# Patient Record
Sex: Female | Born: 1990 | Race: Black or African American | Hispanic: No | Marital: Single | Smoking: Never smoker
Health system: Southern US, Community
[De-identification: ages and names within clinical notes are randomized; demographics above are authoritative.]

## PROBLEM LIST (undated history)

## (undated) HISTORY — PX: APPENDECTOMY: SHX54

---

## 2013-03-09 ENCOUNTER — Observation Stay (HOSPITAL_COMMUNITY)
Admission: EM | Admit: 2013-03-09 | Discharge: 2013-03-10 | Disposition: A | Discharge status: Home / Self Care | Payer: BC Managed Care – PPO | Attending: General Surgery | Admitting: General Surgery

## 2013-03-09 ENCOUNTER — Encounter (HOSPITAL_COMMUNITY): Payer: Self-pay | Admitting: Emergency Medicine

## 2013-03-09 ENCOUNTER — Emergency Department (HOSPITAL_COMMUNITY): Payer: BC Managed Care – PPO

## 2013-03-09 DIAGNOSIS — K3580 Unspecified acute appendicitis: Principal | ICD-10-CM | POA: Insufficient documentation

## 2013-03-09 DIAGNOSIS — K37 Unspecified appendicitis: Secondary | ICD-10-CM

## 2013-03-09 LAB — BASIC METABOLIC PANEL
BUN: 12 mg/dL (ref 6–23)
CHLORIDE: 102 meq/L (ref 96–112)
CO2: 26 meq/L (ref 19–32)
Calcium: 9.4 mg/dL (ref 8.4–10.5)
Creatinine, Ser: 1.06 mg/dL (ref 0.50–1.10)
GFR calc Af Amer: 85 mL/min — ABNORMAL LOW (ref >90)
GFR calc non Af Amer: 73 mL/min — ABNORMAL LOW (ref >90)
GLUCOSE: 88 mg/dL (ref 70–99)
Potassium: 4.1 mEq/L (ref 3.7–5.3)
Sodium: 141 mEq/L (ref 137–147)

## 2013-03-09 LAB — CBC WITH DIFFERENTIAL/PLATELET
BASOS ABS: 0 10*3/uL (ref 0.0–0.1)
Basophils Relative: 0 % (ref 0–1)
Eosinophils Absolute: 0.1 10*3/uL (ref 0.0–0.7)
Eosinophils Relative: 1 % (ref 0–5)
HEMATOCRIT: 39.6 % (ref 36.0–46.0)
HEMOGLOBIN: 13.7 g/dL (ref 12.0–15.0)
LYMPHS ABS: 1.7 10*3/uL (ref 0.7–4.0)
LYMPHS PCT: 22 % (ref 12–46)
MCH: 30.4 pg (ref 26.0–34.0)
MCHC: 34.6 g/dL (ref 30.0–36.0)
MCV: 88 fL (ref 78.0–100.0)
MONO ABS: 0.9 10*3/uL (ref 0.1–1.0)
MONOS PCT: 11 % (ref 3–12)
NEUTROS ABS: 4.9 10*3/uL (ref 1.7–7.7)
Neutrophils Relative %: 65 % (ref 43–77)
Platelets: 260 10*3/uL (ref 150–400)
RBC: 4.5 MIL/uL (ref 3.87–5.11)
RDW: 12.8 % (ref 11.5–15.5)
WBC: 7.4 10*3/uL (ref 4.0–10.5)

## 2013-03-09 LAB — URINALYSIS, ROUTINE W REFLEX MICROSCOPIC
Bilirubin Urine: NEGATIVE
GLUCOSE, UA: NEGATIVE mg/dL
Hgb urine dipstick: NEGATIVE
Ketones, ur: NEGATIVE mg/dL
LEUKOCYTES UA: NEGATIVE
Nitrite: NEGATIVE
PH: 5.5 (ref 5.0–8.0)
Protein, ur: NEGATIVE mg/dL
Specific Gravity, Urine: 1.019 (ref 1.005–1.030)
Urobilinogen, UA: 0.2 mg/dL (ref 0.0–1.0)

## 2013-03-09 LAB — PREGNANCY, URINE: Preg Test, Ur: NEGATIVE

## 2013-03-09 MED ORDER — IOHEXOL 300 MG/ML  SOLN
25.0000 mL | INTRAMUSCULAR | Status: AC
  Administered 2013-03-09: 25 mL via ORAL

## 2013-03-09 MED ORDER — SODIUM CHLORIDE 0.9 % IV SOLN
Freq: Once | INTRAVENOUS | Status: AC
  Administered 2013-03-09: 16:00:00 via INTRAVENOUS

## 2013-03-09 MED ORDER — IOHEXOL 300 MG/ML  SOLN: 100.0000 mL | Freq: Once | INTRAMUSCULAR | Status: AC | PRN

## 2013-03-09 NOTE — ED Notes (Signed)
Pt finished contrast, CT notified

## 2013-03-09 NOTE — ED Notes (Signed)
To ED, [facility], for further eval of LLQ pain since yesterday morning. No fever, vomiting, or nausea associated. States her LMP was 4 days ago. States her last BM was yesterday and 'a little constipation with it'. Appears in nad.

## 2013-03-09 NOTE — ED Notes (Signed)
Pt aware she needs to provide urine sample 

## 2013-03-09 NOTE — ED Notes (Signed)
CT notified this RN that pt is 5 or 6 in line for scan.

## 2013-03-09 NOTE — ED Provider Notes (Signed)
CSN: [id number]     Arrival date & time 03/09/13  1252 History   First MD Initiated Contact with Patient 03/09/13 1541     Chief Complaint  Patient presents with  . Abdominal Pain   (Consider location/radiation/quality/duration/timing/severity/associated sxs/prior Treatment) The history is provided by the patient. No language interpreter was used.    Pt is a 23 yo female c/o RLQ pain. Pt states it is a sharp pain that woke her up from a sleep yesterday at around 0530.  She says it is a 6/10 pain that is worse with certain movements, such as laying supine, but better with standing. She denies hx of the same, urinary changes, vaginal discharge, bowel changes, fever, sick contacts, N/V, chills, or hx of abdominal surgery. She states her LMP was 4 days ago.  History reviewed. No pertinent past medical history. History reviewed. No pertinent past surgical history. No family history on file. History  Substance Use Topics  . Smoking status: Never Smoker   . Smokeless tobacco: Not on file  . Alcohol Use: Yes   OB History   Grav Para Term Preterm Abortions TAB SAB Ect Mult Living                 Review of Systems  Constitutional: Negative for fever and chills.  HENT: Negative.   Respiratory: Negative for shortness of breath and wheezing.   Cardiovascular: Negative for chest pain.  Gastrointestinal: Positive for abdominal pain. Negative for nausea, vomiting, diarrhea, constipation and blood in stool.  Genitourinary: Negative.   Musculoskeletal: Negative.   Neurological: Negative for weakness, numbness and headaches.    Allergies  Review of patient's allergies indicates no known allergies.  Home Medications  No current outpatient prescriptions on file. BP 118/72  Pulse 65  Temp(Src) 98.1 F (36.7 C) (Oral)  Resp 18  Ht 5\' 6"  (1.676 m)  Wt 160 lb (72.576 kg)  BMI 25.84 kg/m2  SpO2 100% Physical Exam  Constitutional: She is oriented to person, place, and time. She appears  well-developed and well-nourished. No distress.  Cardiovascular: Normal rate.  Exam reveals no gallop and no friction rub.   No murmur heard. Pulmonary/Chest: Effort normal and breath sounds normal. She has no wheezes.  Abdominal: Soft. Bowel sounds are normal. She exhibits no distension. There is tenderness in the right lower quadrant.  Musculoskeletal: Normal range of motion.  Neurological: She is alert and oriented to person, place, and time.  Skin: Skin is warm and dry. She is not diaphoretic.  Psychiatric: She has a normal mood and affect.    ED Course  Procedures (including critical care time) Labs Review Labs Reviewed - No data to display Imaging Review No results found.  EKG Interpretation   None      Results for orders placed during the hospital encounter of 03/09/13  SURGICAL PCR SCREEN      Result Value Range   MRSA, PCR NEGATIVE  NEGATIVE   Staphylococcus aureus NEGATIVE  NEGATIVE  CBC WITH DIFFERENTIAL      Result Value Range   WBC 7.4  4.0 - 10.5 K/uL   RBC 4.50  3.87 - 5.11 MIL/uL   Hemoglobin 13.7  12.0 - 15.0 g/dL   HCT 08.639.6  57.836.0 - 46.946.0 %   MCV 88.0  78.0 - 100.0 fL   MCH 30.4  26.0 - 34.0 pg   MCHC 34.6  30.0 - 36.0 g/dL   RDW 62.912.8  52.811.5 - 41.315.5 %   Platelets 260  150 - 400 K/uL   Neutrophils Relative % 65  43 - 77 %   Neutro Abs 4.9  1.7 - 7.7 K/uL   Lymphocytes Relative 22  12 - 46 %   Lymphs Abs 1.7  0.7 - 4.0 K/uL   Monocytes Relative 11  3 - 12 %   Monocytes Absolute 0.9  0.1 - 1.0 K/uL   Eosinophils Relative 1  0 - 5 %   Eosinophils Absolute 0.1  0.0 - 0.7 K/uL   Basophils Relative 0  0 - 1 %   Basophils Absolute 0.0  0.0 - 0.1 K/uL  BASIC METABOLIC PANEL      Result Value Range   Sodium 141  137 - 147 mEq/L   Potassium 4.1  3.7 - 5.3 mEq/L   Chloride 102  96 - 112 mEq/L   CO2 26  19 - 32 mEq/L   Glucose, Bld 88  70 - 99 mg/dL   BUN 12  6 - 23 mg/dL   Creatinine, Ser 1.61  0.50 - 1.10 mg/dL   Calcium 9.4  8.4 - 09.6 mg/dL   GFR calc  non Af Amer 73 (*) >90 mL/min   GFR calc Af Amer 85 (*) >90 mL/min  URINALYSIS, ROUTINE W REFLEX MICROSCOPIC      Result Value Range   Color, Urine YELLOW  YELLOW   APPearance CLEAR  CLEAR   Specific Gravity, Urine 1.019  1.005 - 1.030   pH 5.5  5.0 - 8.0   Glucose, UA NEGATIVE  NEGATIVE mg/dL   Hgb urine dipstick NEGATIVE  NEGATIVE   Bilirubin Urine NEGATIVE  NEGATIVE   Ketones, ur NEGATIVE  NEGATIVE mg/dL   Protein, ur NEGATIVE  NEGATIVE mg/dL   Urobilinogen, UA 0.2  0.0 - 1.0 mg/dL   Nitrite NEGATIVE  NEGATIVE   Leukocytes, UA NEGATIVE  NEGATIVE  PREGNANCY, URINE      Result Value Range   Preg Test, Ur NEGATIVE  NEGATIVE  CBC      Result Value Range   WBC 7.6  4.0 - 10.5 K/uL   RBC 4.40  3.87 - 5.11 MIL/uL   Hemoglobin 12.9  12.0 - 15.0 g/dL   HCT 04.5  40.9 - 81.1 %   MCV 87.3  78.0 - 100.0 fL   MCH 29.3  26.0 - 34.0 pg   MCHC 33.6  30.0 - 36.0 g/dL   RDW 91.4  78.2 - 95.6 %   Platelets 239  150 - 400 K/uL  COMPREHENSIVE METABOLIC PANEL      Result Value Range   Sodium 142  137 - 147 mEq/L   Potassium 3.9  3.7 - 5.3 mEq/L   Chloride 105  96 - 112 mEq/L   CO2 23  19 - 32 mEq/L   Glucose, Bld 94  70 - 99 mg/dL   BUN 12  6 - 23 mg/dL   Creatinine, Ser 2.13  0.50 - 1.10 mg/dL   Calcium 9.0  8.4 - 08.6 mg/dL   Total Protein 7.3  6.0 - 8.3 g/dL   Albumin 3.5  3.5 - 5.2 g/dL   AST 14  0 - 37 U/L   ALT 11  0 - 35 U/L   Alkaline Phosphatase 29 (*) 39 - 117 U/L   Total Bilirubin 0.6  0.3 - 1.2 mg/dL   GFR calc non Af Amer 88 (*) >90 mL/min   GFR calc Af Amer >90  >90 mL/min   Ct Abdomen Pelvis W  Contrast  03/09/2013   CLINICAL DATA:  Left lower quadrant abdominal pain.  EXAM: CT ABDOMEN AND PELVIS WITH CONTRAST  TECHNIQUE: Multidetector CT imaging of the abdomen and pelvis was performed using the standard protocol following bolus administration of intravenous contrast.  CONTRAST:  100 cc Omnipaque 300  COMPARISON:  None.  FINDINGS: The liver, spleen, pancreas, and adrenal  glands appear unremarkable. No specific gallbladder or biliary abnormality identified. Kidneys and proximal ureters unremarkable. No pathologic upper abdominal adenopathy is observed. No pathologic pelvic adenopathy is observed. Urinary bladder unremarkable.  Exaggerated lumbosacral carrying angle. Sacral curvature is exaggerated.  The appendiceal diameter 11 mm with periappendiceal stranding. No regional abscess.  IMPRESSION: 1. Acute appendicitis, with appendiceal diameter of 11 mm and mild periappendiceal stranding. 2. Unusually exaggerated sacral curvature with increased lumbosacral carrying angle which can predispose to back pain. No current lumbar spine impingement.   Electronically Signed   By: Herbie Baltimore M.D.   On: 03/09/2013 21:01    MDM  No diagnosis found. 1. Acute appendicitis  6:35 PM Pt resting comfortably in bed watching Tv.  She states there has been no increase in pain.  No change in pain distribution.  Waiting on CT.  8:32 PM Pt in no increased pain, no change in distribution.  Resting comfortable in bed watching TV.  Waiting on CT.  CT shows acute appendicitis. Dr. Luisa Hart ([facility]) was consulted and accepted the patient onto surgical service.   Arnoldo Hooker, PA-C 03/12/13 [phone]

## 2013-03-09 NOTE — ED Notes (Signed)
Pt states urine test was completed at [facility]

## 2013-03-09 NOTE — ED Notes (Signed)
Pt in CT.

## 2013-03-09 NOTE — ED Notes (Signed)
Pt ambulatory to restroom to provide urine sample.

## 2013-03-09 NOTE — ED Notes (Signed)
CT notified this RN that pt is 3rd in line.

## 2013-03-09 NOTE — H&P (Signed)
Amber Zuniga is an 23 y.o. female.   Chief Complaint: abdominal pain  HPI: 1 day hx of RLQ pain.  Moderate to severe sharp in nature made worse with movement. CT shows acute appendicitis. Anorexia present but [place] now.   History reviewed. No pertinent past medical history.  History reviewed. No pertinent past surgical history.  No family history on file. Social History:  reports that she has never smoked. She does not have any smokeless tobacco history on file. She reports that she drinks alcohol. Her drug history is not on file.  Allergies: No Known Allergies   (Not in a hospital admission)  Results for orders placed during the hospital encounter of 03/09/13 (from the past 48 hour(s))  CBC WITH DIFFERENTIAL     Status: None   Collection Time    03/09/13  4:16 PM      Result Value Range   WBC 7.4  4.0 - 10.5 K/uL   RBC 4.50  3.87 - 5.11 MIL/uL   Hemoglobin 13.7  12.0 - 15.0 g/dL   HCT 39.6  36.0 - 46.0 %   MCV 88.0  78.0 - 100.0 fL   MCH 30.4  26.0 - 34.0 pg   MCHC 34.6  30.0 - 36.0 g/dL   RDW 12.8  11.5 - 15.5 %   Platelets 260  150 - 400 K/uL   Neutrophils Relative % 65  43 - 77 %   Neutro Abs 4.9  1.7 - 7.7 K/uL   Lymphocytes Relative 22  12 - 46 %   Lymphs Abs 1.7  0.7 - 4.0 K/uL   Monocytes Relative 11  3 - 12 %   Monocytes Absolute 0.9  0.1 - 1.0 K/uL   Eosinophils Relative 1  0 - 5 %   Eosinophils Absolute 0.1  0.0 - 0.7 K/uL   Basophils Relative 0  0 - 1 %   Basophils Absolute 0.0  0.0 - 0.1 K/uL  BASIC METABOLIC PANEL     Status: Abnormal   Collection Time    03/09/13  4:16 PM      Result Value Range   Sodium 141  137 - 147 mEq/L   Potassium 4.1  3.7 - 5.3 mEq/L   Chloride 102  96 - 112 mEq/L   CO2 26  19 - 32 mEq/L   Glucose, Bld 88  70 - 99 mg/dL   BUN 12  6 - 23 mg/dL   Creatinine, Ser 1.06  0.50 - 1.10 mg/dL   Calcium 9.4  8.4 - 10.5 mg/dL   GFR calc non Af Amer 73 (*) >90 mL/min   GFR calc Af Amer 85 (*) >90 mL/min   Comment: (NOTE)     The  eGFR has been calculated using the CKD EPI equation.     This calculation has not been validated in all clinical situations.     eGFR's persistently <90 mL/min signify possible Chronic Kidney     Disease.  URINALYSIS, ROUTINE W REFLEX MICROSCOPIC     Status: None   Collection Time    03/09/13  5:09 PM      Result Value Range   Color, Urine YELLOW  YELLOW   APPearance CLEAR  CLEAR   Specific Gravity, Urine 1.019  1.005 - 1.030   pH 5.5  5.0 - 8.0   Glucose, UA NEGATIVE  NEGATIVE mg/dL   Hgb urine dipstick NEGATIVE  NEGATIVE   Bilirubin Urine NEGATIVE  NEGATIVE   Ketones, ur NEGATIVE  NEGATIVE mg/dL   Protein, ur NEGATIVE  NEGATIVE mg/dL   Urobilinogen, UA 0.2  0.0 - 1.0 mg/dL   Nitrite NEGATIVE  NEGATIVE   Leukocytes, UA NEGATIVE  NEGATIVE   Comment: MICROSCOPIC NOT DONE ON URINES WITH NEGATIVE PROTEIN, BLOOD, LEUKOCYTES, NITRITE, OR GLUCOSE <1000 mg/dL.  PREGNANCY, URINE     Status: None   Collection Time    03/09/13  5:09 PM      Result Value Range   Preg Test, Ur NEGATIVE  NEGATIVE   Comment:            THE SENSITIVITY OF THIS     METHODOLOGY IS >20 mIU/mL.   Ct Abdomen Pelvis W Contrast  03/09/2013   CLINICAL DATA:  Left lower quadrant abdominal pain.  EXAM: CT ABDOMEN AND PELVIS WITH CONTRAST  TECHNIQUE: Multidetector CT imaging of the abdomen and pelvis was performed using the standard protocol following bolus administration of intravenous contrast.  CONTRAST:  100 cc Omnipaque 300  COMPARISON:  None.  FINDINGS: The liver, spleen, pancreas, and adrenal glands appear unremarkable. No specific gallbladder or biliary abnormality identified. Kidneys and proximal ureters unremarkable. No pathologic upper abdominal adenopathy is observed. No pathologic pelvic adenopathy is observed. Urinary bladder unremarkable.  Exaggerated lumbosacral carrying angle. Sacral curvature is exaggerated.  The appendiceal diameter 11 mm with periappendiceal stranding. No regional abscess.  IMPRESSION: 1.  Acute appendicitis, with appendiceal diameter of 11 mm and mild periappendiceal stranding. 2. Unusually exaggerated sacral curvature with increased lumbosacral carrying angle which can predispose to back pain. No current lumbar spine impingement.   Electronically Signed   By: Sherryl Barters M.D.   On: 03/09/2013 21:01    Review of Systems  Constitutional: Negative for fever and chills.  HENT: Negative.   Eyes: Negative.   Respiratory: Negative.   Gastrointestinal: Positive for abdominal pain.  Genitourinary: Negative.   Musculoskeletal: Negative.   Skin: Negative.   Neurological: Negative.   Endo/Heme/Allergies: Negative.   Psychiatric/Behavioral: Negative.     Blood pressure 110/92, pulse 77, temperature 98.1 F (36.7 C), temperature source Oral, resp. rate 18, height 5' 6"  (1.676 m), weight 160 lb (72.576 kg), last menstrual period 03/05/2013, SpO2 100.00%. Physical Exam  Constitutional: She is oriented to person, place, and time. She appears well-developed and well-nourished.  HENT:  Head: Normocephalic and atraumatic.  Eyes: EOM are normal. Pupils are equal, round, and reactive to light.  Neck: Normal range of motion. Neck supple.  Cardiovascular: Normal rate and regular rhythm.   Respiratory: Effort normal and breath sounds normal.  GI: There is tenderness. There is rebound and tenderness at McBurney's point.  Musculoskeletal: Normal range of motion.  Neurological: She is alert and oriented to person, place, and time.  Skin: Skin is warm and dry.  Psychiatric: She has a normal mood and affect. Her behavior is normal. Judgment and thought content normal.     Assessment/Plan Acute appendicitis early Start IV ABX.   NPO Medical managent for now . If no better lap appendectomy in am.   Teri Diltz A. 03/09/2013, 10:38 PM

## 2013-03-09 NOTE — ED Notes (Signed)
Patient transported to CT 

## 2013-03-10 ENCOUNTER — Encounter: Payer: Self-pay | Admitting: General Surgery

## 2013-03-10 ENCOUNTER — Encounter (HOSPITAL_COMMUNITY): Payer: Self-pay | Admitting: *Deleted

## 2013-03-10 ENCOUNTER — Observation Stay (HOSPITAL_COMMUNITY): Payer: BC Managed Care – PPO | Admitting: Anesthesiology

## 2013-03-10 ENCOUNTER — Encounter (HOSPITAL_COMMUNITY)
Admission: EM | Disposition: A | Discharge status: Home / Self Care | Payer: Self-pay | Source: Home / Self Care | Attending: Emergency Medicine

## 2013-03-10 ENCOUNTER — Encounter (HOSPITAL_COMMUNITY): Payer: BC Managed Care – PPO | Admitting: Anesthesiology

## 2013-03-10 HISTORY — PX: LAPAROSCOPIC APPENDECTOMY: SHX408

## 2013-03-10 LAB — CBC
HEMATOCRIT: 38.4 % (ref 36.0–46.0)
Hemoglobin: 12.9 g/dL (ref 12.0–15.0)
MCH: 29.3 pg (ref 26.0–34.0)
MCHC: 33.6 g/dL (ref 30.0–36.0)
MCV: 87.3 fL (ref 78.0–100.0)
PLATELETS: 239 10*3/uL (ref 150–400)
RBC: 4.4 MIL/uL (ref 3.87–5.11)
RDW: 12.7 % (ref 11.5–15.5)
WBC: 7.6 10*3/uL (ref 4.0–10.5)

## 2013-03-10 LAB — COMPREHENSIVE METABOLIC PANEL
ALBUMIN: 3.5 g/dL (ref 3.5–5.2)
ALT: 11 U/L (ref 0–35)
AST: 14 U/L (ref 0–37)
Alkaline Phosphatase: 29 U/L — ABNORMAL LOW (ref 39–117)
BUN: 12 mg/dL (ref 6–23)
CO2: 23 mEq/L (ref 19–32)
CREATININE: 0.91 mg/dL (ref 0.50–1.10)
Calcium: 9 mg/dL (ref 8.4–10.5)
Chloride: 105 mEq/L (ref 96–112)
GFR calc Af Amer: >90 mL/min (ref >90)
GFR calc non Af Amer: 88 mL/min — ABNORMAL LOW (ref >90)
Glucose, Bld: 94 mg/dL (ref 70–99)
Potassium: 3.9 mEq/L (ref 3.7–5.3)
Sodium: 142 mEq/L (ref 137–147)
TOTAL PROTEIN: 7.3 g/dL (ref 6.0–8.3)
Total Bilirubin: 0.6 mg/dL (ref 0.3–1.2)

## 2013-03-10 LAB — SURGICAL PCR SCREEN
MRSA, PCR: NEGATIVE
STAPHYLOCOCCUS AUREUS: NEGATIVE

## 2013-03-10 SURGERY — APPENDECTOMY, LAPAROSCOPIC
Anesthesia: General | Site: Abdomen

## 2013-03-10 MED ORDER — IBUPROFEN 200 MG PO TABS: ORAL_TABLET | ORAL | Status: AC

## 2013-03-10 MED ORDER — MIDAZOLAM HCL 5 MG/5ML IJ SOLN
INTRAMUSCULAR | Status: DC | PRN
  Administered 2013-03-10: 2 mg via INTRAVENOUS

## 2013-03-10 MED ORDER — FENTANYL CITRATE 0.05 MG/ML IJ SOLN
INTRAMUSCULAR | Status: AC
  Filled 2013-03-10: qty 5

## 2013-03-10 MED ORDER — ROCURONIUM BROMIDE 50 MG/5ML IV SOLN
INTRAVENOUS | Status: AC
  Filled 2013-03-10: qty 1

## 2013-03-10 MED ORDER — OXYCODONE-ACETAMINOPHEN 5-325 MG PO TABS
1.0000 | ORAL_TABLET | ORAL | Status: DC | PRN
  Administered 2013-03-10: 2 via ORAL
  Filled 2013-03-10: qty 2

## 2013-03-10 MED ORDER — ONDANSETRON HCL 4 MG/2ML IJ SOLN: 4.0000 mg | Freq: Four times a day (QID) | INTRAMUSCULAR | Status: DC | PRN

## 2013-03-10 MED ORDER — OXYCODONE-ACETAMINOPHEN 5-325 MG PO TABS: 1.0000 | ORAL_TABLET | ORAL | Status: DC | PRN

## 2013-03-10 MED ORDER — PROPOFOL 10 MG/ML IV BOLUS
INTRAVENOUS | Status: AC
  Filled 2013-03-10: qty 20

## 2013-03-10 MED ORDER — MIDAZOLAM HCL 2 MG/2ML IJ SOLN
INTRAMUSCULAR | Status: AC
  Filled 2013-03-10: qty 2

## 2013-03-10 MED ORDER — PANTOPRAZOLE SODIUM 40 MG IV SOLR
40.0000 mg | Freq: Every day | INTRAVENOUS | Status: DC
Start: 2013-03-10 — End: 2013-03-10
  Administered 2013-03-10: 40 mg via INTRAVENOUS
  Filled 2013-03-10 (×2): qty 40

## 2013-03-10 MED ORDER — HYDROMORPHONE HCL PF 1 MG/ML IJ SOLN
1.0000 mg | INTRAMUSCULAR | Status: DC | PRN
  Administered 2013-03-10 (×2): 1 mg via INTRAVENOUS
  Filled 2013-03-10 (×2): qty 1

## 2013-03-10 MED ORDER — OXYCODONE HCL 5 MG PO TABS: 5.0000 mg | ORAL_TABLET | Freq: Once | ORAL | Status: DC | PRN

## 2013-03-10 MED ORDER — GLYCOPYRROLATE 0.2 MG/ML IJ SOLN
INTRAMUSCULAR | Status: AC
  Filled 2013-03-10: qty 1

## 2013-03-10 MED ORDER — ARTIFICIAL TEARS OP OINT
TOPICAL_OINTMENT | OPHTHALMIC | Status: AC
  Filled 2013-03-10: qty 3.5

## 2013-03-10 MED ORDER — SODIUM CHLORIDE 0.9 % IR SOLN
Status: DC | PRN
  Administered 2013-03-10: 1000 mL

## 2013-03-10 MED ORDER — BUPIVACAINE-EPINEPHRINE 0.25% -1:200000 IJ SOLN
INTRAMUSCULAR | Status: DC | PRN
  Administered 2013-03-10: 15 mL

## 2013-03-10 MED ORDER — ACETAMINOPHEN 325 MG PO TABS: 650.0000 mg | ORAL_TABLET | Freq: Four times a day (QID) | ORAL | Status: AC | PRN

## 2013-03-10 MED ORDER — SUCCINYLCHOLINE CHLORIDE 20 MG/ML IJ SOLN
INTRAMUSCULAR | Status: DC | PRN
  Administered 2013-03-10: 120 mg via INTRAVENOUS

## 2013-03-10 MED ORDER — 0.9 % SODIUM CHLORIDE (POUR BTL) OPTIME
TOPICAL | Status: DC | PRN
  Administered 2013-03-10: 1000 mL

## 2013-03-10 MED ORDER — HYDROMORPHONE HCL PF 1 MG/ML IJ SOLN
INTRAMUSCULAR | Status: AC
  Filled 2013-03-10: qty 1

## 2013-03-10 MED ORDER — DEXTROSE IN LACTATED RINGERS 5 % IV SOLN
INTRAVENOUS | Status: DC
  Administered 2013-03-10: 04:00:00 via INTRAVENOUS

## 2013-03-10 MED ORDER — PROMETHAZINE HCL 25 MG/ML IJ SOLN: 6.2500 mg | INTRAMUSCULAR | Status: DC | PRN

## 2013-03-10 MED ORDER — ENOXAPARIN SODIUM 40 MG/0.4ML ~~LOC~~ SOLN
40.0000 mg | SUBCUTANEOUS | Status: DC
  Administered 2013-03-10: 40 mg via SUBCUTANEOUS
  Filled 2013-03-10: qty 0.4

## 2013-03-10 MED ORDER — SUCCINYLCHOLINE CHLORIDE 20 MG/ML IJ SOLN
INTRAMUSCULAR | Status: AC
  Filled 2013-03-10: qty 1

## 2013-03-10 MED ORDER — LIDOCAINE HCL (CARDIAC) 20 MG/ML IV SOLN
INTRAVENOUS | Status: DC | PRN
  Administered 2013-03-10: 100 mg via INTRAVENOUS

## 2013-03-10 MED ORDER — ARTIFICIAL TEARS OP OINT
TOPICAL_OINTMENT | OPHTHALMIC | Status: DC | PRN
  Administered 2013-03-10: 1 via OPHTHALMIC

## 2013-03-10 MED ORDER — NEOSTIGMINE METHYLSULFATE 1 MG/ML IJ SOLN
INTRAMUSCULAR | Status: DC | PRN
  Administered 2013-03-10: 3 mg via INTRAVENOUS
  Administered 2013-03-10: 1 mg via INTRAVENOUS

## 2013-03-10 MED ORDER — PROPOFOL 10 MG/ML IV BOLUS
INTRAVENOUS | Status: DC | PRN
  Administered 2013-03-10: 200 mg via INTRAVENOUS

## 2013-03-10 MED ORDER — FENTANYL CITRATE 0.05 MG/ML IJ SOLN
INTRAMUSCULAR | Status: DC | PRN
  Administered 2013-03-10 (×2): 50 ug via INTRAVENOUS

## 2013-03-10 MED ORDER — MEPERIDINE HCL 25 MG/ML IJ SOLN: 6.2500 mg | INTRAMUSCULAR | Status: DC | PRN

## 2013-03-10 MED ORDER — HYDROMORPHONE HCL PF 1 MG/ML IJ SOLN
0.2500 mg | INTRAMUSCULAR | Status: DC | PRN
  Administered 2013-03-10 (×2): 0.25 mg via INTRAVENOUS

## 2013-03-10 MED ORDER — OXYCODONE HCL 5 MG/5ML PO SOLN: 5.0000 mg | Freq: Once | ORAL | Status: DC | PRN

## 2013-03-10 MED ORDER — NEOSTIGMINE METHYLSULFATE 1 MG/ML IJ SOLN
INTRAMUSCULAR | Status: AC
  Filled 2013-03-10: qty 10

## 2013-03-10 MED ORDER — GLYCOPYRROLATE 0.2 MG/ML IJ SOLN
INTRAMUSCULAR | Status: DC | PRN
  Administered 2013-03-10: 0.6 mg via INTRAVENOUS
  Administered 2013-03-10: 0.2 mg via INTRAVENOUS

## 2013-03-10 MED ORDER — LACTATED RINGERS IV SOLN
INTRAVENOUS | Status: DC | PRN
  Administered 2013-03-10 (×2): via INTRAVENOUS

## 2013-03-10 MED ORDER — DEXTROSE 5 % IV SOLN
INTRAVENOUS | Status: AC
  Filled 2013-03-10: qty 1

## 2013-03-10 MED ORDER — ACETAMINOPHEN 325 MG PO TABS: 650.0000 mg | ORAL_TABLET | Freq: Four times a day (QID) | ORAL | Status: DC | PRN

## 2013-03-10 MED ORDER — ROCURONIUM BROMIDE 100 MG/10ML IV SOLN
INTRAVENOUS | Status: DC | PRN
  Administered 2013-03-10: 25 mg via INTRAVENOUS

## 2013-03-10 MED ORDER — LIDOCAINE HCL (CARDIAC) 20 MG/ML IV SOLN
INTRAVENOUS | Status: AC
  Filled 2013-03-10: qty 5

## 2013-03-10 MED ORDER — ONDANSETRON HCL 4 MG/2ML IJ SOLN
INTRAMUSCULAR | Status: DC | PRN
  Administered 2013-03-10: 4 mg via INTRAVENOUS

## 2013-03-10 MED ORDER — BUPIVACAINE-EPINEPHRINE (PF) 0.25% -1:200000 IJ SOLN
INTRAMUSCULAR | Status: AC
  Filled 2013-03-10: qty 30

## 2013-03-10 MED ORDER — ACETAMINOPHEN 650 MG RE SUPP: 650.0000 mg | Freq: Four times a day (QID) | RECTAL | Status: DC | PRN

## 2013-03-10 MED ORDER — BSS IO SOLN
INTRAOCULAR | Status: AC
  Filled 2013-03-10: qty 15

## 2013-03-10 MED ORDER — LACTATED RINGERS IV SOLN
Freq: Once | INTRAVENOUS | Status: AC
  Administered 2013-03-10: 09:00:00 via INTRAVENOUS

## 2013-03-10 MED ORDER — DEXTROSE 5 % IV SOLN
1.0000 g | Freq: Four times a day (QID) | INTRAVENOUS | Status: DC
  Administered 2013-03-10 (×3): 1 g via INTRAVENOUS
  Filled 2013-03-10 (×6): qty 1

## 2013-03-10 SURGICAL SUPPLY — 37 items
APPLIER CLIP ROT 10 11.4 M/L (STAPLE)
CANISTER SUCTION 2500CC (MISCELLANEOUS) ×3 IMPLANT
CHLORAPREP W/TINT 26ML (MISCELLANEOUS) ×3 IMPLANT
CLIP APPLIE ROT 10 11.4 M/L (STAPLE) IMPLANT
CONT SPEC STER OR (MISCELLANEOUS) ×6 IMPLANT
COVER SURGICAL LIGHT HANDLE (MISCELLANEOUS) ×3 IMPLANT
CUTTER FLEX LINEAR 45M (STAPLE) ×3 IMPLANT
DERMABOND ADVANCED (GAUZE/BANDAGES/DRESSINGS) ×2
DERMABOND ADVANCED .7 DNX12 (GAUZE/BANDAGES/DRESSINGS) ×1 IMPLANT
ELECT REM PT RETURN 9FT ADLT (ELECTROSURGICAL) ×3
ELECTRODE REM PT RTRN 9FT ADLT (ELECTROSURGICAL) ×1 IMPLANT
GLOVE BIO SURGEON STRL SZ7 (GLOVE) ×3 IMPLANT
GLOVE BIOGEL PI IND STRL 7.0 (GLOVE) ×2 IMPLANT
GLOVE BIOGEL PI IND STRL 7.5 (GLOVE) ×1 IMPLANT
GLOVE BIOGEL PI INDICATOR 7.0 (GLOVE) ×4
GLOVE BIOGEL PI INDICATOR 7.5 (GLOVE) ×2
GLOVE SURG SS PI 7.0 STRL IVOR (GLOVE) ×6 IMPLANT
GOWN STRL NON-REIN LRG LVL3 (GOWN DISPOSABLE) ×9 IMPLANT
KIT BASIN OR (CUSTOM PROCEDURE TRAY) ×3 IMPLANT
KIT ROOM TURNOVER OR (KITS) ×3 IMPLANT
NS IRRIG 1000ML POUR BTL (IV SOLUTION) ×3 IMPLANT
PAD ARMBOARD 7.5X6 YLW CONV (MISCELLANEOUS) ×6 IMPLANT
POUCH SPECIMEN RETRIEVAL 10MM (ENDOMECHANICALS) ×3 IMPLANT
RELOAD 45 VASCULAR/THIN (ENDOMECHANICALS) ×3 IMPLANT
RELOAD STAPLE TA45 3.5 REG BLU (ENDOMECHANICALS) ×3 IMPLANT
SCALPEL HARMONIC ACE (MISCELLANEOUS) ×3 IMPLANT
SCISSORS LAP 5X35 DISP (ENDOMECHANICALS) IMPLANT
SET IRRIG TUBING LAPAROSCOPIC (IRRIGATION / IRRIGATOR) ×3 IMPLANT
SLEEVE ENDOPATH XCEL 5M (ENDOMECHANICALS) ×3 IMPLANT
SPECIMEN JAR SMALL (MISCELLANEOUS) ×3 IMPLANT
SUT MNCRL AB 4-0 PS2 18 (SUTURE) ×3 IMPLANT
TOWEL OR 17X24 6PK STRL BLUE (TOWEL DISPOSABLE) ×3 IMPLANT
TOWEL OR 17X26 10 PK STRL BLUE (TOWEL DISPOSABLE) ×3 IMPLANT
TRAY FOLEY CATH 16FR SILVER (SET/KITS/TRAYS/PACK) ×3 IMPLANT
TRAY LAPAROSCOPIC (CUSTOM PROCEDURE TRAY) ×3 IMPLANT
TROCAR XCEL BLUNT TIP 100MML (ENDOMECHANICALS) ×3 IMPLANT
TROCAR XCEL NON-BLD 5MMX100MML (ENDOMECHANICALS) ×3 IMPLANT

## 2013-03-10 NOTE — ED Notes (Signed)
Transporting patient to new room assignment. 

## 2013-03-10 NOTE — Anesthesia Procedure Notes (Signed)
Procedure Name: Intubation Date/Time: 03/10/2013 9:35 AM Performed by: Fransisca KaufmannMEYER, Caydyn Sprung E Pre-anesthesia Checklist: Patient identified, Emergency Drugs available, Suction available, Patient being monitored and Timeout performed Patient Re-evaluated:Patient Re-evaluated prior to inductionOxygen Delivery Method: Circle system utilized Preoxygenation: Pre-oxygenation with 100% oxygen Intubation Type: IV induction and Cricoid Pressure applied Laryngoscope Size: Miller and 2 Grade View: Grade I Tube type: Oral Tube size: 7.5 mm Number of attempts: 1 Airway Equipment and Method: Stylet Placement Confirmation: ETT inserted through vocal cords under direct vision,  positive ETCO2 and breath sounds checked- equal and bilateral Secured at: 22 cm Tube secured with: Tape Dental Injury: Teeth and Oropharynx as per pre-operative assessment

## 2013-03-10 NOTE — Progress Notes (Signed)
Per MD order, pt walking, voiding and eating/tolerating solid foods. No nausea/vomiting. Pt discharged to home. D/c instructions given, no questions verbalized. Vitals stable.

## 2013-03-10 NOTE — OR Nursing (Signed)
CONTACTS REMOVED BY CRNA AND GIVEN TO PT'S BOYFRIEND IN WAITING ROOM. RING AND UNDERWEAR REMOVED IN OR AND PLACED IN LABELED BAGS. SENT WITH PATIENT TO PACU.

## 2013-03-10 NOTE — Progress Notes (Signed)
  Subjective: Still with rlq pain  Objective: Vital signs in last 24 hours: Temp:  [98.1 F (36.7 C)-98.5 F (36.9 C)] 98.5 F (36.9 C) (02/02 0610) Pulse Rate:  [57-80] 59 (02/02 0610) Resp:  [16-18] 18 (02/02 0610) BP: (104-125)/(70-92) 114/70 mmHg (02/02 0610) SpO2:  [97 %-100 %] 98 % (02/02 0610) Weight:  [160 lb (72.576 kg)] 160 lb (72.576 kg) (02/01 1300)    Intake/Output from previous day:   Intake/Output this shift:    GI: soft, rlq tenderness  Lab Results:   Recent Labs  03/09/13 1616 03/10/13 0545  WBC 7.4 7.6  HGB 13.7 12.9  HCT 39.6 38.4  PLT 260 239   BMET  Recent Labs  03/09/13 1616 03/10/13 0545  NA 141 142  K 4.1 3.9  CL 102 105  CO2 26 23  GLUCOSE 88 94  BUN 12 12  CREATININE 1.06 0.91  CALCIUM 9.4 9.0   PT/INR No results found for this basename: LABPROT, INR,  in the last 72 hours ABG No results found for this basename: PHART, PCO2, PO2, HCO3,  in the last 72 hours  Studies/Results: Ct Abdomen Pelvis W Contrast  03/09/2013   CLINICAL DATA:  Left lower quadrant abdominal pain.  EXAM: CT ABDOMEN AND PELVIS WITH CONTRAST  TECHNIQUE: Multidetector CT imaging of the abdomen and pelvis was performed using the standard protocol following bolus administration of intravenous contrast.  CONTRAST:  100 cc Omnipaque 300  COMPARISON:  None.  FINDINGS: The liver, spleen, pancreas, and adrenal glands appear unremarkable. No specific gallbladder or biliary abnormality identified. Kidneys and proximal ureters unremarkable. No pathologic upper abdominal adenopathy is observed. No pathologic pelvic adenopathy is observed. Urinary bladder unremarkable.  Exaggerated lumbosacral carrying angle. Sacral curvature is exaggerated.  The appendiceal diameter 11 mm with periappendiceal stranding. No regional abscess.  IMPRESSION: 1. Acute appendicitis, with appendiceal diameter of 11 mm and mild periappendiceal stranding. 2. Unusually exaggerated sacral curvature with  increased lumbosacral carrying angle which can predispose to back pain. No current lumbar spine impingement.   Electronically Signed   By: Herbie BaltimoreWalt  Liebkemann M.D.   On: 03/09/2013 21:01    Anti-infectives: Anti-infectives   Start     Dose/Rate Route Frequency Ordered Stop   03/10/13 0045  cefOXitin (MEFOXIN) 1 g in dextrose 5 % 50 mL IVPB     1 g 100 mL/hr over 30 Minutes Intravenous 4 times per day 03/10/13 0036        Assessment/Plan: Acute appendicitis  Lap appy this am.discussed risks/benefits    [facility] CenterWAKEFIELD,Khiley Lieser 03/10/2013

## 2013-03-10 NOTE — Anesthesia Preprocedure Evaluation (Signed)
Anesthesia Evaluation  Patient identified by MRN, date of birth, ID band Patient awake    Reviewed: Allergy & Precautions, H&P , NPO status , Patient's Chart, lab work & pertinent test results  Airway       Dental  (+) Dental Advisory Given   Pulmonary neg pulmonary ROS,  breath sounds clear to auscultation        Cardiovascular negative cardio ROS  Rhythm:Regular Rate:Normal     Neuro/Psych negative neurological ROS  negative psych ROS   GI/Hepatic negative GI ROS, Neg liver ROS,   Endo/Other  negative endocrine ROS  Renal/GU negative Renal ROS  negative genitourinary   Musculoskeletal negative musculoskeletal ROS (+)   Abdominal   Peds  Hematology negative hematology ROS (+)   Anesthesia Other Findings   Reproductive/Obstetrics negative OB ROS                           Anesthesia Physical Anesthesia Plan  ASA: I and emergent  Anesthesia Plan: General   Post-op Pain Management:    Induction: Intravenous and Rapid sequence  Airway Management Planned: Oral ETT  Additional Equipment:   Intra-op Plan:   Post-operative Plan: Extubation in OR  Informed Consent: I have reviewed the patients History and Physical, chart, labs and discussed the procedure including the risks, benefits and alternatives for the proposed anesthesia with the patient or authorized representative who has indicated his/her understanding and acceptance.   Dental advisory given  Plan Discussed with: CRNA  Anesthesia Plan Comments:         Anesthesia Quick Evaluation

## 2013-03-10 NOTE — Transfer of Care (Signed)
Immediate Anesthesia Transfer of Care Note  Patient: Amber PollockCarissa Zuniga  Procedure(s) Performed: Procedure(s): LAPAROSCOPIC APPENDECTOMY (N/A)  Patient Location: PACU  Anesthesia Type:General  Level of Consciousness: awake, alert , oriented and sedated  Airway & Oxygen Therapy: Patient Spontanous Breathing and Patient connected to nasal cannula oxygen  Post-op Assessment: Report given to PACU RN, Post -op Vital signs reviewed and stable and Patient moving all extremities  Post vital signs: Reviewed and stable  Complications: No apparent anesthesia complications

## 2013-03-10 NOTE — Progress Notes (Signed)
UR completed 

## 2013-03-10 NOTE — Anesthesia Postprocedure Evaluation (Signed)
Anesthesia Post Note  Patient: Jeffie PollockCarissa Isaac  Procedure(s) Performed: Procedure(s) (LRB): LAPAROSCOPIC APPENDECTOMY (N/A)  Anesthesia type: General  Patient location: PACU  Post pain: Pain level controlled  Post assessment: Post-op Vital signs reviewed  Last Vitals: BP 114/73  Pulse 81  Temp(Src) 37 C (Oral)  Resp 17  Ht 5\' 6"  (1.676 m)  Wt 160 lb (72.576 kg)  BMI 25.84 kg/m2  SpO2 100%  LMP 03/05/2013  Post vital signs: Reviewed  Level of consciousness: sedated  Complications: No apparent anesthesia complications

## 2013-03-10 NOTE — Progress Notes (Signed)
Patient arrived safely to the unit via wheelchair from the ED.

## 2013-03-10 NOTE — Discharge Instructions (Signed)
[facility] LAPAROSCOPIC SURGERY: POST OP INSTRUCTIONS  Always review your discharge instruction sheet given to you by the facility where your surgery was performed. IF YOU HAVE DISABILITY OR FAMILY LEAVE FORMS, YOU MUST BRING THEM TO THE OFFICE FOR PROCESSING.   DO NOT GIVE THEM TO YOUR DOCTOR.  1. A prescription for pain medication may be given to you upon discharge.  Take your pain medication as prescribed, if needed.  If narcotic pain medicine is not needed, then you may take acetaminophen (Tylenol), naprosyn (Alleve), or ibuprofen (Advil) as needed. 2. Take your usually prescribed medications unless otherwise directed. 3. If you need a refill on your pain medication, please contact your pharmacy.  They will contact our office to request authorization. Prescriptions will not be filled after 5pm or on week-ends. 4. You should follow a light diet the first few days after arrival home, such as soup and crackers, etc.  Be sure to include lots of fluids daily. 5. Most patients will experience some swelling and bruising in the area of the incisions.  Ice packs will help.  Swelling and bruising can take several days to resolve.  6. It is common to experience some constipation if taking pain medication after surgery.  Increasing fluid intake and taking a stool softener (such as Colace) will usually help or prevent this problem from occurring.  A mild laxative (Milk of Magnesia or Miralax) should be taken according to package instructions if there are no bowel movements after 48 hours. 7. Unless discharge instructions indicate otherwise, you may remove your bandages 48 hours after surgery, and you may shower at that time.  You may have steri-strips (small skin tapes) in place directly over the incision.  These strips should be left on the skin for 7-10 days.  If your surgeon used skin glue on the incision, you may shower in 24 hours.  The glue will flake  off over the next 2-3 weeks.  Any sutures or staples will be removed at the office during your follow-up visit. 8. ACTIVITIES:  You may resume regular (light) daily activities beginning the next day--such as daily self-care, walking, climbing stairs--gradually increasing activities as tolerated.  You may have sexual intercourse when it is comfortable.  Refrain from any heavy lifting or straining until approved by your doctor. a. You may drive when you are no longer taking prescription pain medication, you can comfortably wear a seatbelt, and you can safely maneuver your car and apply brakes. b. RETURN TO WORK:  __________________________________________________________ 9. You should see your doctor in the office for a follow-up appointment approximately 2-3 weeks after your surgery.  Make sure that you call for this appointment within a day or two after you arrive home to insure a convenient appointment time. 10. OTHER INSTRUCTIONS: __________________________________________________________________________________________________________________________ __________________________________________________________________________________________________________________________ WHEN TO CALL YOUR DOCTOR: 1. Fever over 101.0 2. Inability to urinate 3. Continued bleeding from incision. 4. Increased pain, redness, or drainage from the incision. 5. Increasing abdominal pain  The clinic staff is available to answer your questions during regular business hours.  Please dont hesitate to call and ask to speak to one of the nurses for clinical concerns.  If you have a medical emergency, go to the nearest emergency room or call 911.  A surgeon from [facility] is always on call at the hospital. [address], [place], [place], [place]  [address] ? [address], [place], [place]   [address] [phone] ? [phone] ? [phone] [phone]  [phone] Web site:  [email or web address]   Laparoscopic Appendectomy Appendectomy is surgery to remove the appendix. Laparoscopic surgery uses several small cuts (incisions) instead of one large incision. Laparoscopic surgery offers a shorter recovery time and less discomfort. LET YOUR CAREGIVER KNOW ABOUT:  Allergies to food or medicine.  Medicines taken, including vitamins, dietary supplements, herbs, eyedrops, over-the-counter medicines, and creams.  Use of steroids (by mouth or creams).  Previous problems with anesthetics or numbing medicines.  History of bleeding problems or blood clots.  Previous surgery.  Other health problems, including diabetes, heart problems, lung problems, and kidney problems.  Possibility of pregnancy, if this applies. RISKS AND COMPLICATIONS  Infection. A germ starts growing in the wound. This can usually be treated with antibiotics. In some cases, the wound will need to be opened and cleaned.  Bleeding.  Damage to other organs.  Sores (abscesses).  Chronic pain at the incision sites. This is defined as pain that lasts for more than 3 months.  Blood clots in the legs that may rarely travel to the lungs.  Infection in the lungs (pneumonia). BEFORE THE PROCEDURE Appendectomy is usually performed immediately after an inflamed appendix (appendicitis) is diagnosed. No preparation is necessary ahead of this procedure. PROCEDURE  You will be given medicine that makes you sleep (general anesthetic). After you are asleep, a flexible tube (catheter) may be inserted into your bladder to drain your urine during surgery. The tube is removed before you wake up after surgery. When you are asleep, carbondioxide gas will be used to inflate your abdomen. This will allow your surgeon to see inside your abdomen and perform your surgery. Three small incisions will be made in your abdomen. Your surgeon will insert a thin, lighted tube (laparoscope) through one of the  incisions. Your surgeon will look through the laparoscope while performing the surgery. Other tools will be inserted through the other incisions. Laparoscopic procedures may not be appropriate when:  There is major scarring from a previous surgery.  The patient has bleeding disorders.  A pregnancy is near term.  There are other conditions which make the laparoscopic procedure impossible, such as an advanced infection or a ruptured appendix. If your surgeon feels it is not safe to continue with the laparoscopic procedure, he or she will perform an open surgery instead. This gives the surgeon a larger view and more space to work. Open surgery requires a longer recovery time. After your appendix is removed, your incisions will be closed with stitches (sutures) or skin adhesive. AFTER THE PROCEDURE You will be taken to a recovery room. When the anesthesia has worn off, you will be returned to your hospital room. You will be given pain medicines to keep you comfortable. Ask your caregiver how long your hospital stay will be. Document Released: 09/07/2003 Document Revised: 04/17/2011 Document Reviewed: 08/02/2010 [facility] Patient Information 2014 [employer] [place].

## 2013-03-10 NOTE — Op Note (Signed)
Preoperative diagnosis: Acute appendicitis Postoperative diagnosis: Same as above Procedure: Laparoscopic appendectomy Surgeon: Dr. Harden MoMatt Logen Heintzelman Anesthesia: Gen. Estimated blood loss: Minimal Complications: None Specimens: Appendix to pathology Drains: None Sponge count was correct at end of case Disposition to recovery stable  Indications: This is 23 yo female who is admitted with right lower quadrant pain and a CT scan that shows appendicitis. I discussed with her proceeding to the operating room for a laparoscopic appendectomy and the risks and benefits associated with that.  Procedure: After informed consent was obtained the patient was taken to the operating room. She was maintained on antibiotics already on the floor. She had sequential compression devices on her legs. She was placed under general anesthesia without complication. A Foley catheter was placed. She was then prepped and draped in the standard sterile surgical fashion. A surgical timeout was then performed.  I infiltrated Marcaine below her umbilicus. I made a vertical incision. I grasped her fascia with Kocher clamps. I then incised this and entered the peritoneum bluntly. I then placed a 0 Vicryl pursestring suture through the fascia. I then inserted a Hassan trocar and insufflated the abdomen to 15 mm mercury pressure. I then inserted 2 further 5 mm trocars in the suprapubic region and left lower quadrant. I was able to identify the appendix in the right lower quadrant. She was noted to have acute suppurative appendicitis. I then was able to dissect the appendix from the surrounding structures. She had a lot of adhesions to her abdominal sidewall or small bowel that were taken down with a combination of blunt dissection and the harmonic scalpel. Eventually I was able to clearly identify the base. This was difficult at first due to the fact that she had a lot of congenital adhesions. I then stapled across the base. I then  divided the mesoappendix with the harmonic scalpel. The appendix was placed in a bag and removed from the umbilicus. I then viewed the stump and this was clearly on the cecum. I then noted that it was hemostatic. I then irrigated this until it was clear. I then removed my Hassan trocar and tied this down. This completely obliterated the umbilical defect. I then desufflated the abdomen and removed the remaining trocars. These were closed with Monocryl and Dermabond. She tolerated this well was extubated and transferred to recovery stable.

## 2013-03-10 NOTE — Preoperative (Signed)
Beta Blockers   Reason not to administer Beta Blockers:Not Applicable 

## 2013-03-11 ENCOUNTER — Encounter (HOSPITAL_COMMUNITY): Payer: Self-pay | Admitting: General Surgery

## 2013-03-12 NOTE — ED Provider Notes (Signed)
Medical screening examination/treatment/procedure(s) were performed by non-physician practitioner and as supervising physician I was immediately available for consultation/collaboration.      Bethzy Hauck B. Bernette MayersSheldon, MD 03/12/13 1616

## 2013-03-12 NOTE — Discharge Summary (Signed)
Physician Discharge Summary  Patient ID: Jeffie PollockCarissa Rasberry MRN: [id number] DOB/AGE: Jan 03, 1991 23 y.o.  Admit date: 03/09/2013 Discharge date: 03/12/2013  Admission Diagnoses:  Acute appendicitis   Discharge Diagnoses: Same Active Problems:   Appendicitis   PROCEDURES: Laparoscopic appendectomy, 03/10/2013,  Emelia LoronMatthew Wakefield, MD      Hospital Course: 1 day hx of RLQ pain. Moderate to severe sharp in nature made worse with movement. CT shows acute appendicitis. Anorexia present but Guineahungary now.  Pt was admitted by Dr Luisa Hartornett and taken to the OR the following morning.  She did well and wanted to go home that evening.  We had her eat a real meal before going home.  She will follow up with the clinic in 2 weeks.  Condition on d/c:  Improved.   Disposition: 01-Home or Self Care   Future Appointments Provider Department Dept Phone   04/01/2013 2:45 PM [facility] [place] [phone]       Medication List         acetaminophen 325 MG tablet  Commonly known as:  TYLENOL  Take 2 tablets (650 mg total) by mouth every 6 (six) hours as needed for mild pain (do not take more than 4000 mg of Tylenol(acetaminophen) per day.).     ibuprofen 200 MG tablet  Commonly known as:  MOTRIN IB  You can take 2-3 tablets every 6 hours as needed.     oxyCODONE-acetaminophen 5-325 MG per tablet  Commonly known as:  PERCOCET/ROXICET  Take 1-2 tablets by mouth every 4 (four) hours as needed for moderate pain.           Follow-up Information   Follow up with [facility] On 04/01/2013. (Your appointment is at 2:45, be there 30minutes early for check in.)    Contact information:   [address]   [place] [id number] [phone]       Signed: Sherrie GeorgeJENNINGS,Leighana Neyman 03/12/2013, 4:15 PM

## 2013-04-01 ENCOUNTER — Ambulatory Visit (INDEPENDENT_AMBULATORY_CARE_PROVIDER_SITE_OTHER): Payer: BC Managed Care – PPO | Admitting: General Surgery

## 2013-04-01 ENCOUNTER — Encounter (INDEPENDENT_AMBULATORY_CARE_PROVIDER_SITE_OTHER): Payer: Self-pay | Admitting: General Surgery

## 2013-04-01 VITALS — BP 104/62 | HR 72 | Temp 98.5°F | Resp 14 | Ht 66.0 in | Wt 167.0 lb

## 2013-04-01 DIAGNOSIS — K3580 Unspecified acute appendicitis: Secondary | ICD-10-CM

## 2013-04-01 NOTE — Patient Instructions (Signed)
Follow up as needed

## 2013-04-01 NOTE — Progress Notes (Signed)
Amber Zuniga 1990-11-30 [id number] 04/01/2013   History of Present Illness: Amber PollockCarissa Zuniga is a  23 y.o. female who presents today status post lap appy by Dr. Dwain SarnaWakefield.  Pathology reveals fibrous obliteration of appendix with appendicitis.  The patient is tolerating a regular diet, having normal bowel movements, has good pain control.  She  is back to most normal activities.   Physical Exam: Abd: soft, nontender, active bowel sounds, nondistended.  All incisions are well healed.  Impression: 1.  Acute appendicitis, s/p lap appy  Plan: She  is able to return to normal activities. She  may follow up on a prn basis.

## 2014-10-26 IMAGING — CT CT ABD-PELV W/ CM
2 of 4 series · 16 of 46 positions shown, 18 images · IV contrast (APPLIED)
Comparison: None.

CLINICAL DATA: Left lower quadrant abdominal pain.

EXAM:
CT ABDOMEN AND PELVIS WITH CONTRAST
TECHNIQUE: Multidetector CT imaging of the abdomen and pelvis was performed
using the standard protocol following bolus administration of
intravenous contrast.
CONTRAST:  100 cc Omnipaque 300

[Series 2: abd/ pelvis 5.0 i30f 1 · axial · 0.70mm/px · z∈[+879,+1254]mm · 13 of 83 slices shown, 15 images]
[im 4/83  soft-tissue]
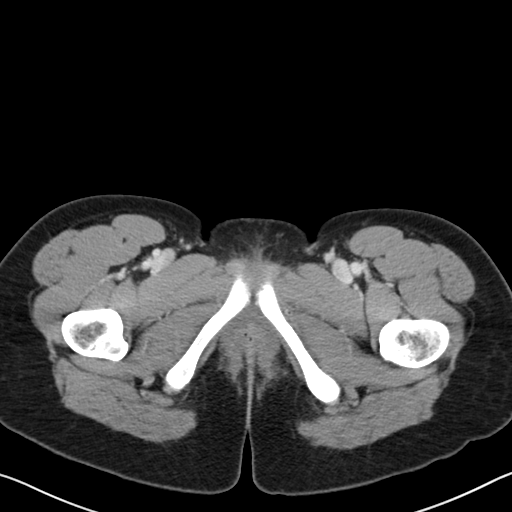
[im 4/83  bone]
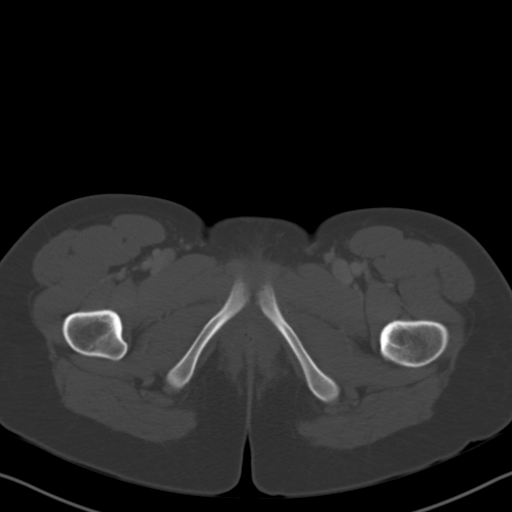
[im 11/83  soft-tissue]
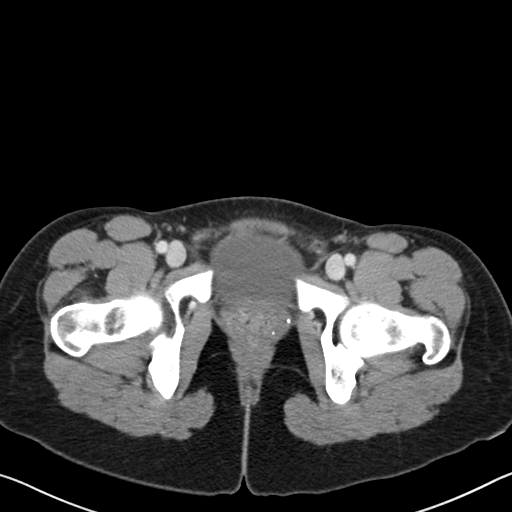
[im 18/83  soft-tissue]
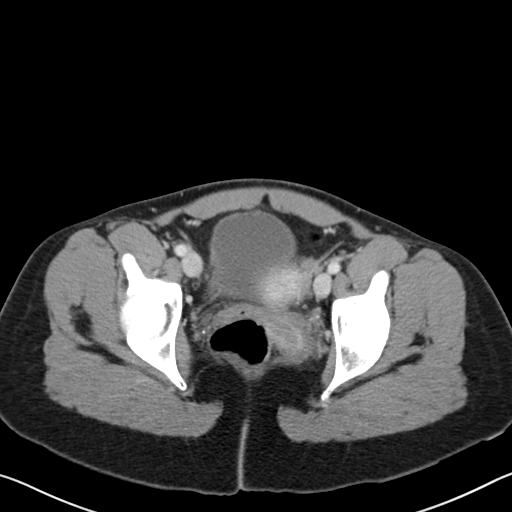
[im 24/83  soft-tissue]
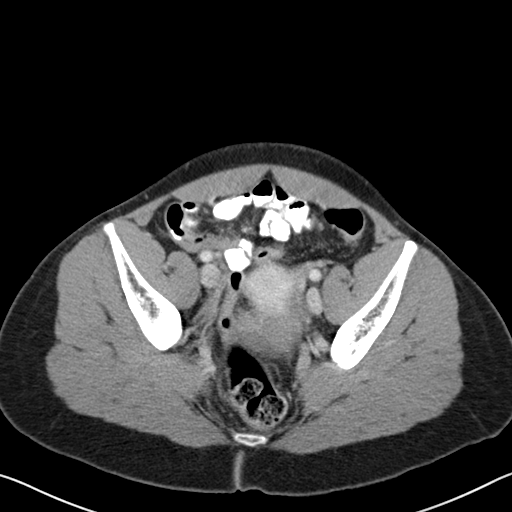
[im 28/83  soft-tissue]
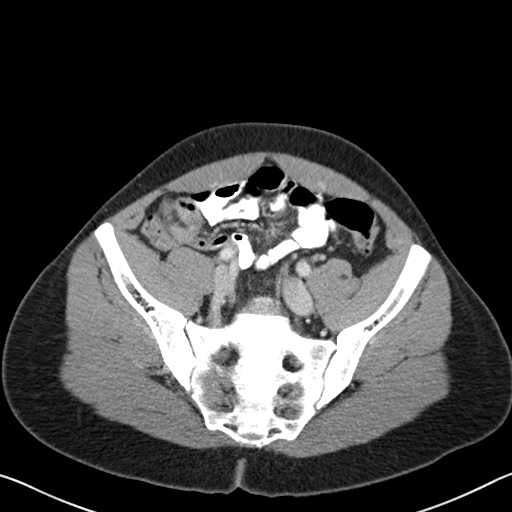
[im 35/83  soft-tissue]
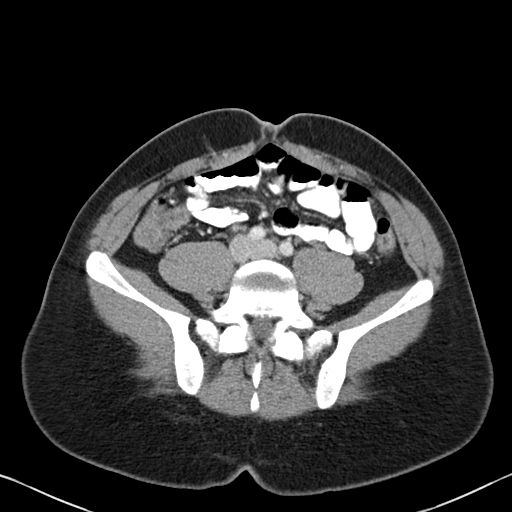
[im 42/83  soft-tissue]
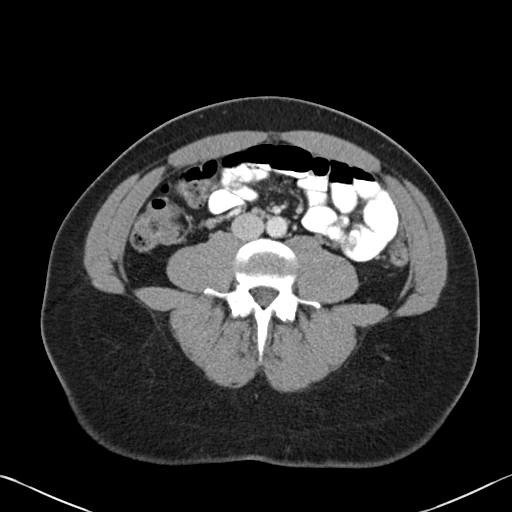
[im 48/83  soft-tissue]
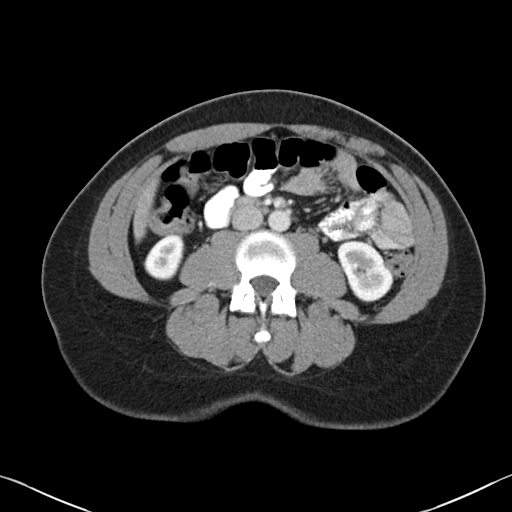
[im 55/83  soft-tissue]
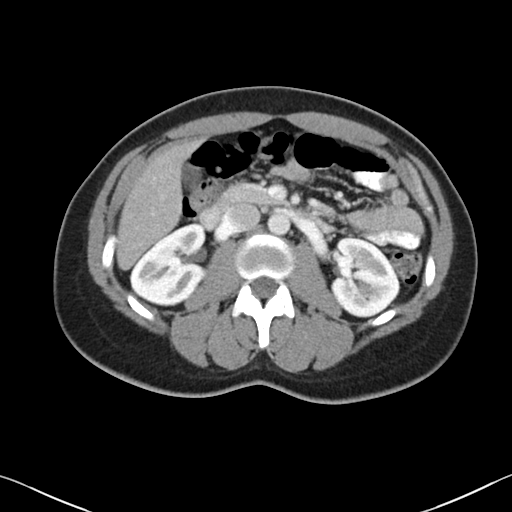
[im 55/83  bone]
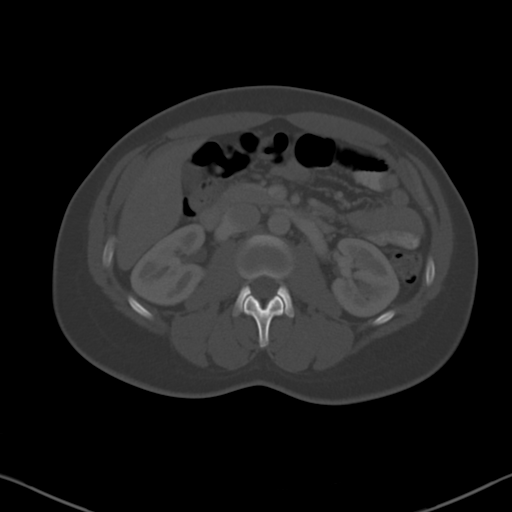
[im 59/83  soft-tissue]
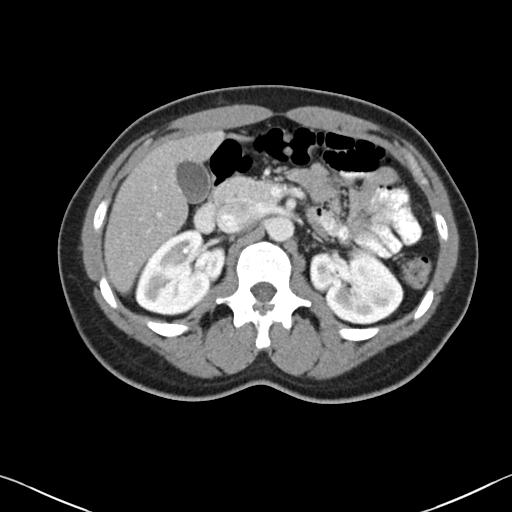
[im 65/83  soft-tissue]
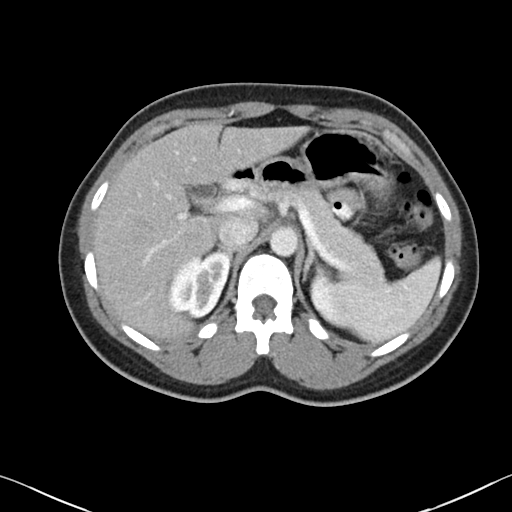
[im 72/83  soft-tissue]
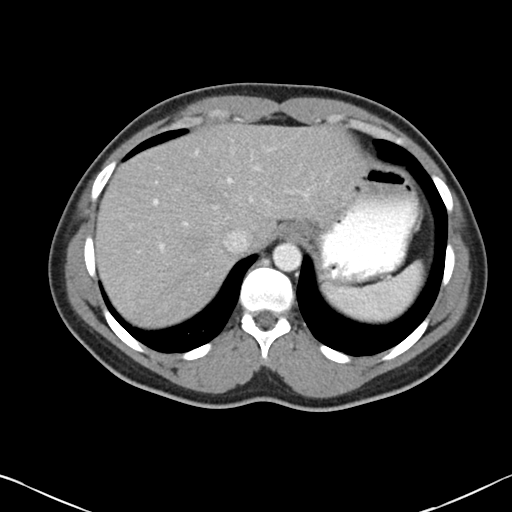
[im 79/83  soft-tissue]
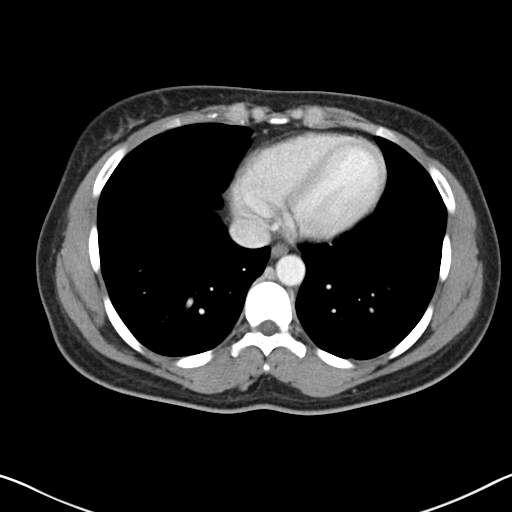

[Series 5: coronals · coronal · 0.70mm/px · 3 of 109 slices shown]
[im 37/109  soft-tissue]
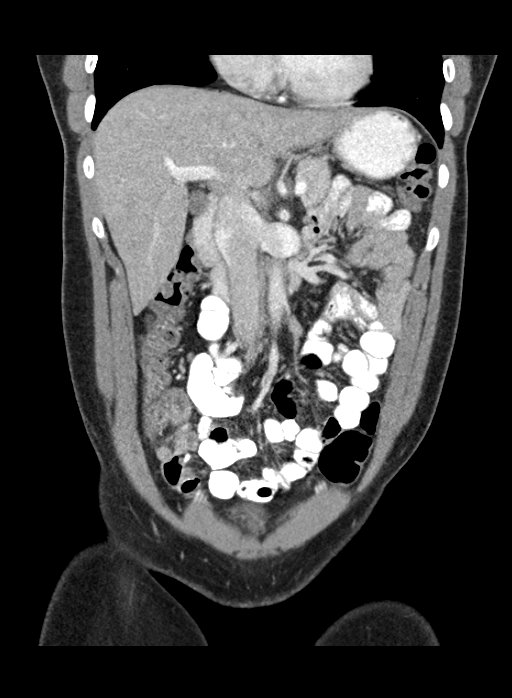
[im 49/109  soft-tissue]
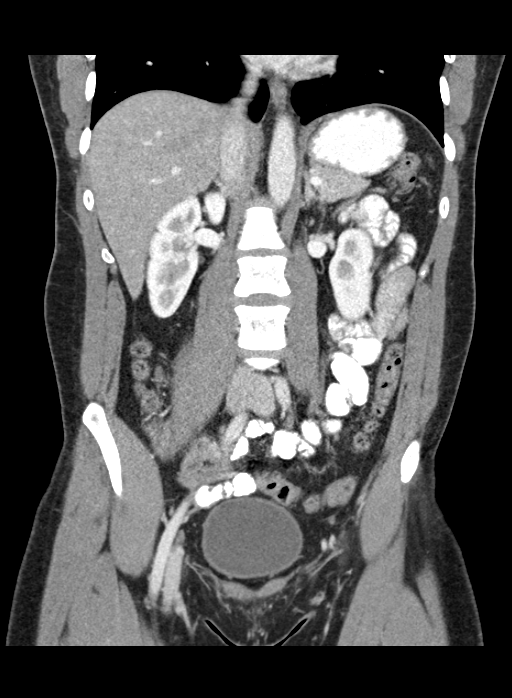
[im 61/109  soft-tissue]
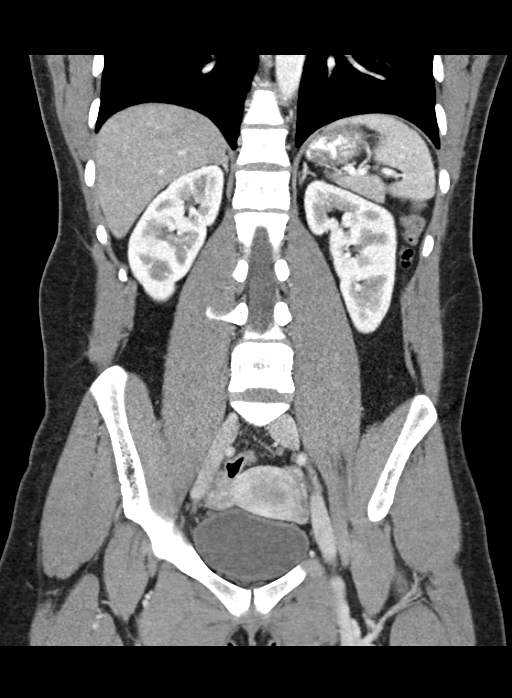

[16 of 46 positions shown; findings below may reference images not displayed]

FINDINGS: The liver, spleen, pancreas, and adrenal glands appear unremarkable.
No specific gallbladder or biliary abnormality identified. Kidneys
and proximal ureters unremarkable. No pathologic upper abdominal
adenopathy is observed. No pathologic pelvic adenopathy is observed.
Urinary bladder unremarkable.

Exaggerated lumbosacral carrying angle. Sacral curvature is
exaggerated.

The appendiceal diameter 11 mm with periappendiceal stranding. No
regional abscess.
IMPRESSION: 1. Acute appendicitis, with appendiceal diameter of 11 mm and mild
periappendiceal stranding.
2. Unusually exaggerated sacral curvature with increased lumbosacral
carrying angle which can predispose to back pain. No current lumbar
spine impingement.
